# Patient Record
Sex: Male | Born: 2000 | Race: White | Hispanic: No | Marital: Single | Smoking: Never smoker
Health system: Southern US, Community
[De-identification: ages and names within clinical notes are randomized; demographics above are authoritative.]

## PROBLEM LIST (undated history)

## (undated) DIAGNOSIS — T7840XA Allergy, unspecified, initial encounter: Secondary | ICD-10-CM

## (undated) DIAGNOSIS — T4145XA Adverse effect of unspecified anesthetic, initial encounter: Secondary | ICD-10-CM

## (undated) DIAGNOSIS — F419 Anxiety disorder, unspecified: Secondary | ICD-10-CM

## (undated) DIAGNOSIS — Z8669 Personal history of other diseases of the nervous system and sense organs: Secondary | ICD-10-CM

## (undated) DIAGNOSIS — F840 Autistic disorder: Secondary | ICD-10-CM

## (undated) DIAGNOSIS — Z8659 Personal history of other mental and behavioral disorders: Secondary | ICD-10-CM

## (undated) DIAGNOSIS — R569 Unspecified convulsions: Secondary | ICD-10-CM

## (undated) DIAGNOSIS — F909 Attention-deficit hyperactivity disorder, unspecified type: Secondary | ICD-10-CM

## (undated) DIAGNOSIS — D563 Thalassemia minor: Secondary | ICD-10-CM

## (undated) DIAGNOSIS — T8859XA Other complications of anesthesia, initial encounter: Secondary | ICD-10-CM

## (undated) DIAGNOSIS — F509 Eating disorder, unspecified: Secondary | ICD-10-CM

## (undated) HISTORY — PX: CIRCUMCISION: SUR203

## (undated) HISTORY — PX: DENTAL SURGERY: SHX609

---
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
## 2021-04-23 ENCOUNTER — Other Ambulatory Visit: Payer: Self-pay

## 2021-04-23 ENCOUNTER — Emergency Department (HOSPITAL_COMMUNITY)
Admission: EM | Admit: 2021-04-23 | Discharge: 2021-04-24 | Disposition: A | Discharge status: Home / Self Care | Payer: Medicaid Other | Attending: Emergency Medicine | Admitting: Emergency Medicine

## 2021-04-23 DIAGNOSIS — J101 Influenza due to other identified influenza virus with other respiratory manifestations: Secondary | ICD-10-CM | POA: Insufficient documentation

## 2021-04-23 DIAGNOSIS — Z20822 Contact with and (suspected) exposure to covid-19: Secondary | ICD-10-CM | POA: Insufficient documentation

## 2021-04-23 DIAGNOSIS — J111 Influenza due to unidentified influenza virus with other respiratory manifestations: Secondary | ICD-10-CM

## 2021-04-23 DIAGNOSIS — R509 Fever, unspecified: Secondary | ICD-10-CM | POA: Diagnosis present

## 2021-04-23 LAB — RESP PANEL BY RT-PCR (FLU A&B, COVID) ARPGX2
Influenza A by PCR: POSITIVE — AB
Influenza B by PCR: NEGATIVE
SARS Coronavirus 2 by RT PCR: NEGATIVE

## 2021-04-23 MED ORDER — IBUPROFEN 400 MG PO TABS
600.0000 mg | ORAL_TABLET | Freq: Once | ORAL | Status: AC
  Administered 2021-04-23: 600 mg via ORAL
  Filled 2021-04-23: qty 2

## 2021-04-23 NOTE — ED Notes (Signed)
Mother reports that pt had febrile seizures as a small child.

## 2021-04-23 NOTE — ED Triage Notes (Signed)
Cough, nasal congestion, headache, and fever x 1 day up to 101.7 F.

## 2021-04-23 NOTE — ED Triage Notes (Signed)
Pt had 2 capsules of nighttime dose of [employer] brand Nyquil equivalent at 1930.

## 2021-04-24 NOTE — ED Provider Notes (Signed)
[facility] EMERGENCY DEPARTMENT Provider Note   CSN: [id number] Arrival date & time: 04/23/21  2107     History Chief Complaint  Patient presents with   Fever    Willie Manning is a 20 y.o. male.  Fever as high as 101.5 throughout day. No resp symptoms. Has felt chills. No abdominal pain. No ear pain. No cough. No change in defecation or urination. No change in mental status. Mother concerned as he has history of febrile seizures as an infant.    Fever     Past Medical History:  Diagnosis Date   ADHD (attention deficit hyperactivity disorder)    Allergy    Anxiety    Autism    Complication of anesthesia    RED RASH. MOM THINKS HEAT RASH DUE TO BLANKETS. 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  Social History   Tobacco Use   Smoking status: Never   Smokeless tobacco: Never  Substance Use Topics   Alcohol use: No   Drug use: No    Home Medications Prior to Admission medications   Medication Sig Start Date End Date Taking? Authorizing Provider  cetirizine (ZYRTEC) 10 MG tablet Take 10 mg by mouth daily as needed for allergies.   Yes [provider]  cloNIDine (CATAPRES) 0.1 MG tablet Take 0.1 mg by mouth 2 (two) times daily.   Yes [provider]  cloNIDine (CATAPRES) 0.2 MG tablet Take 0.2 mg by mouth at bedtime.   Yes  [provider]  cloNIDine (CATAPRES) 0.2 MG tablet Take by mouth. 07/13/15  Yes [provider]  cyproheptadine (PERIACTIN) 4 MG tablet Take by mouth. 03/09/21  Yes [provider]  lisdexamfetamine (VYVANSE) 50 MG capsule Take 60 mg by mouth daily.   Yes [provider]  Nutritional Supplements (ENSURE ORIGINAL) LIQD Ensure Plus 5 per day by mouth. Please strawberry flavor. 04/19/21  Yes [provider]  Pediatric Multiple Vit-C-FA (PEDIATRIC MULTIVITAMIN) chewable tablet Chew 1 tablet by mouth daily.   Yes [provider]  cyproheptadine (PERIACTIN) 4 MG tablet Take 4 mg by mouth 2 (two) times daily.    [provider]  lisdexamfetamine (VYVANSE) 50 MG capsule Take 60 mg by mouth.    [provider]  predniSONE (DELTASONE) 20 MG tablet Take 2 tablets (40 mg total) by mouth daily with breakfast. 09/08/20   Pollina, Canary Brim, MD    Allergies    Coconut oil and Coconut (cocos nucifera) allergy skin test  Review of Systems   Review of Systems  Constitutional:  Positive for fever.  All other systems reviewed and are negative.  Physical Exam Updated Vital Signs BP 128/76 (BP Location: Right Arm)   Pulse (!) 114  Temp 98.2 F (36.8 C) (Oral)   Resp 16   Ht 5\' 6"  (1.676 m)   Wt 53.7 kg   SpO2 98%   BMI 19.11 kg/m   Physical Exam Vitals and nursing note reviewed.  Constitutional:      Appearance: He is well-developed.  HENT:     Head: Normocephalic and atraumatic.     Right Ear: Tympanic membrane normal.     Left Ear: Tympanic membrane normal.     Mouth/Throat:     Mouth: Mucous membranes are moist.     Pharynx: No posterior oropharyngeal erythema.  Eyes:     Pupils: Pupils are equal, round, and reactive to light.  Cardiovascular:     Rate and Rhythm: Normal rate.     Comments: Triage HR at 134, after defervescence (on my exam) it was 112. Pulmonary:     Effort: Pulmonary effort is normal. No  respiratory distress.  Abdominal:     General: Abdomen is flat. There is no distension.  Musculoskeletal:        General: No swelling or tenderness. Normal range of motion.     Cervical back: Normal range of motion.  Skin:    General: Skin is warm and dry.  Neurological:     General: No focal deficit present.     Mental Status: He is alert. Mental status is at baseline.    ED Results / Procedures / Treatments   Labs (all labs ordered are listed, but only abnormal results are displayed) Labs Reviewed  RESP PANEL BY RT-PCR (FLU A&B, COVID) ARPGX2 - Abnormal; Notable for the following components:      Result Value   Influenza A by PCR POSITIVE (*)    All other components within normal limits    EKG None  Radiology No results found.  Procedures Procedures   Medications Ordered in ED Medications  ibuprofen (ADVIL) tablet 600 mg (600 mg Oral Given 04/23/21 2128)    ED Course  I have reviewed the triage vital signs and the nursing notes.  Pertinent labs & imaging results that were available during my care of the patient were reviewed by me and considered in my medical decision making (see chart for details).    MDM Rules/Calculators/A&P                         Patient overall appears well.  Positive for flu.  Not a candidate for Tamiflu relative to the risk-benefit profile.  Patient overall stable for discharge with strict return precautions  Final Clinical Impression(s) / ED Diagnoses Final diagnoses:  Influenza    Rx / DC Orders ED Discharge Orders     None        Mustapha Colson, 2129, MD 04/25/21 [phone]

## 2023-05-23 ENCOUNTER — Other Ambulatory Visit: Payer: Self-pay

## 2023-05-23 ENCOUNTER — Emergency Department (HOSPITAL_COMMUNITY)
Admission: EM | Admit: 2023-05-23 | Discharge: 2023-05-23 | Disposition: A | Discharge status: Home / Self Care | Payer: MEDICAID | Attending: Emergency Medicine | Admitting: Emergency Medicine

## 2023-05-23 DIAGNOSIS — R059 Cough, unspecified: Secondary | ICD-10-CM | POA: Insufficient documentation

## 2023-05-23 DIAGNOSIS — R040 Epistaxis: Secondary | ICD-10-CM | POA: Diagnosis not present

## 2023-05-23 DIAGNOSIS — R509 Fever, unspecified: Secondary | ICD-10-CM | POA: Diagnosis present

## 2023-05-23 DIAGNOSIS — F840 Autistic disorder: Secondary | ICD-10-CM | POA: Insufficient documentation

## 2023-05-23 DIAGNOSIS — R0981 Nasal congestion: Secondary | ICD-10-CM | POA: Diagnosis not present

## 2023-05-23 DIAGNOSIS — J111 Influenza due to unidentified influenza virus with other respiratory manifestations: Secondary | ICD-10-CM

## 2023-05-23 NOTE — ED Triage Notes (Signed)
Per mother pt has been running a fever since yesterday, highest 102.5. Tylenol given @ 2200, mother is concerned that is fever "isn't breaking". Temp in triage 99.5.    Pt is autistic

## 2023-05-23 NOTE — ED Provider Notes (Signed)
[facility] Provider Note   CSN: [id number] Arrival date & time: 05/23/23  0101     History  Chief Complaint  Patient presents with   Fever    Willie Manning is a 22 y.o. male.  The history is provided by the patient and a parent.   Patient with history of autism, ADHD presents with fever.  This started over a day ago.  Tmax is 102.  Patient has had nasal congestion, mild cough and an episode of epistaxis.  No vomiting or diarrhea.  No mental status changes.  No respiratory distress is reported.  No travel.  No sick contacts Mom is concerned when the fever got high as he has a distant history of febrile seizures   Past Medical History:  Diagnosis Date   ADHD (attention deficit hyperactivity disorder)    Allergy    Anxiety    Autism    Complication of anesthesia    RED RASH. MOM THINKS HEAT RASH DUE TO BLANKETS. 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 Authorizing Provider  cetirizine (ZYRTEC) 10 MG tablet Take 10 mg by mouth daily as needed for allergies.    [provider]  cloNIDine (CATAPRES) 0.1 MG tablet Take 0.1 mg by mouth 2 (two) times daily.    [provider]  cloNIDine (CATAPRES) 0.2 MG tablet Take 0.2 mg by mouth at bedtime.    [provider]  cloNIDine (CATAPRES) 0.2 MG tablet Take by mouth. 07/13/15   [provider]  cyproheptadine (PERIACTIN) 4 MG tablet Take 4 mg by mouth 2 (two) times daily.    [provider]  cyproheptadine (PERIACTIN) 4 MG tablet Take by mouth. 03/09/21   [provider]  lisdexamfetamine (VYVANSE) 50 MG capsule Take 60 mg by mouth daily.     [provider]  lisdexamfetamine (VYVANSE) 50 MG capsule Take 60 mg by mouth.    [provider]  Nutritional Supplements (ENSURE ORIGINAL) LIQD Ensure Plus 5 per day by mouth. Please strawberry flavor. 04/19/21   [provider]  Pediatric Multiple Vit-C-FA (PEDIATRIC MULTIVITAMIN) chewable tablet Chew 1 tablet by mouth daily.    [provider]  predniSONE (DELTASONE) 20 MG tablet Take 2 tablets (40 mg total) by mouth daily with breakfast. 09/08/20   Pollina, Canary Brim, MD      Allergies    Coconut (cocos nucifera) and Cocos nucifera    Review of Systems   Review of Systems  Physical Exam Updated Vital Signs BP 136/87   Pulse 99   Temp 99.5 F (37.5 C)   Resp 19   Wt 54.4 kg   SpO2 98%   BMI 19.37 kg/m  Physical Exam CONSTITUTIONAL: Thin appearing, no distress, using his phone HEAD: Normocephalic/atraumatic EYES: EOMI/PERRL ENMT: Mucous membranes moist, uvula midline, no stridor, no drooling, no exudates, no edema No evidence of epistaxis, nasal congestion noted NECK: supple no meningeal signs CV: S1/S2 noted, no murmurs/rubs/gallops noted LUNGS: Lungs are clear to auscultation bilaterally, no apparent distress ABDOMEN: soft, nontender NEURO: Pt is awake/alert moves all extremitiesx4.  No facial droop.  EXTREMITIES: pulses normal/equal, full ROM SKIN: warm, color normal, no rash   ED Results / Procedures / Treatments   Labs (all labs ordered are listed, but only abnormal results are displayed) Labs Reviewed - No data to display  EKG None  Radiology No results found.  Procedures Procedures    Medications Ordered in ED Medications - No data to display  ED Course/ Medical Decision Making/ A&P                                 Medical Decision Making  Patient with history of autism presents with fever.  He has had congestion, mild cough and an episode of epistaxis.  No vomiting or diarrhea.  Mental status is at his  baseline.  Other provides entire history.  Now the fevers improved, mother feels comfortable sending him home.  She declines any further testing, declines viral testing, x-ray and labs  Discussed need to let the patient rest, push p.o. fluids and we discussed strict return precautions        Final Clinical Impression(s) / ED Diagnoses Final diagnoses:  Influenza-like illness    Rx / DC Orders ED Discharge Orders     None         Zadie Rhine, MD 05/23/23 0157
